# Patient Record
Sex: Male | Born: 1995 | Race: Black or African American | Hispanic: No | Marital: Single | Smoking: Current every day smoker
Health system: Southern US, Community
[De-identification: ages and names within clinical notes are randomized; demographics above are authoritative.]

## PROBLEM LIST (undated history)

## (undated) ENCOUNTER — Emergency Department (HOSPITAL_COMMUNITY): Admission: EM | Payer: Self-pay | Source: Home / Self Care

## (undated) HISTORY — PX: HERNIA REPAIR: SHX51

---

## 2013-04-06 ENCOUNTER — Emergency Department: Payer: Self-pay | Admitting: Emergency Medicine

## 2013-11-23 ENCOUNTER — Emergency Department: Payer: Self-pay | Admitting: Emergency Medicine

## 2014-06-08 ENCOUNTER — Emergency Department: Payer: Self-pay | Admitting: Student

## 2014-06-08 LAB — CBC
HCT: 46.2 % (ref 40.0–52.0)
HGB: 15.1 g/dL (ref 13.0–18.0)
MCH: 30.2 pg (ref 26.0–34.0)
MCHC: 32.7 g/dL (ref 32.0–36.0)
MCV: 92 fL (ref 80–100)
Platelet: 318 10*3/uL (ref 150–440)
RBC: 5 10*6/uL (ref 4.40–5.90)
RDW: 13.4 % (ref 11.5–14.5)
WBC: 6.4 10*3/uL (ref 3.8–10.6)

## 2014-06-08 LAB — BASIC METABOLIC PANEL
Anion Gap: 12 (ref 7–16)
BUN: 8 mg/dL (ref 7–18)
CALCIUM: 9.8 mg/dL (ref 8.5–10.1)
Chloride: 105 mmol/L (ref 98–107)
Co2: 21 mmol/L (ref 21–32)
Creatinine: 1.4 mg/dL — ABNORMAL HIGH (ref 0.60–1.30)
EGFR (African American): >60
Glucose: 168 mg/dL — ABNORMAL HIGH (ref 65–99)
Osmolality: 278 (ref 275–301)
Potassium: 4.1 mmol/L (ref 3.5–5.1)
Sodium: 138 mmol/L (ref 136–145)

## 2014-06-08 LAB — PROTIME-INR
INR: 1
Prothrombin Time: 12.9 secs (ref 11.5–14.7)

## 2014-06-08 LAB — APTT: Activated PTT: 24.8 secs (ref 23.6–35.9)
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
## 2015-01-26 ENCOUNTER — Emergency Department
Admit: 2015-01-26 | Disposition: A | Discharge status: Home / Self Care | Payer: Self-pay | Admitting: Emergency Medicine

## 2015-09-24 ENCOUNTER — Emergency Department
Admission: EM | Admit: 2015-09-24 | Discharge: 2015-09-24 | Disposition: A | Discharge status: Home / Self Care | Payer: Self-pay | Attending: Student | Admitting: Student

## 2015-09-24 ENCOUNTER — Encounter: Payer: Self-pay | Admitting: Emergency Medicine

## 2015-09-24 DIAGNOSIS — R112 Nausea with vomiting, unspecified: Secondary | ICD-10-CM | POA: Insufficient documentation

## 2015-09-24 DIAGNOSIS — F17210 Nicotine dependence, cigarettes, uncomplicated: Secondary | ICD-10-CM | POA: Insufficient documentation

## 2015-09-24 MED ORDER — ONDANSETRON 4 MG PO TBDP
4.0000 mg | ORAL_TABLET | Freq: Once | ORAL | Status: DC
  Filled 2015-09-24: qty 1

## 2015-09-24 NOTE — ED Provider Notes (Signed)
[facility] Emergency Department Provider Note  ____________________________________________  Time seen: Approximately 4:47 AM  I have reviewed the triage vital signs and the nursing notes.   HISTORY  Chief Complaint Emesis    HPI Marvin Myers is a 19 y.o. male with no chronic medical problems who presents for evaluation of and they have resolved nonbloody nonbilious emesis, gradual onset, currently resolved, no modifying factors. Patient reports that on Thanksgiving he vomited multiple times. He also vomited once yesterday morning but has had no recurrent vomiting, no diarrhea, no fevers or chills and has been tolerating by mouth intake very well. He reports he was seen in the ER yesterday and is requesting a work note. No chest pain and difficulty breathing.   History reviewed. No pertinent past medical history.  There are no active problems to display for this patient.   History reviewed. No pertinent past surgical history.  No current outpatient prescriptions on file.  Allergies Review of patient's allergies indicates no known allergies.  History reviewed. No pertinent family history.  Social History Social History  Substance Use Topics  . Smoking status: Current Every Day Smoker -- 2.00 packs/day    Types: Cigars  . Smokeless tobacco: None  . Alcohol Use: Yes    Review of Systems Constitutional: No fever/chills Eyes: No visual changes. ENT: No sore throat. Cardiovascular: Denies chest pain. Respiratory: Denies shortness of breath. Gastrointestinal: No abdominal pain.  + nausea, + vomiting.  No diarrhea.  No constipation. Genitourinary: Negative for dysuria. Musculoskeletal: Negative for back pain. Skin: Negative for rash. Neurological: Negative for headaches, focal weakness or numbness.  10-point ROS otherwise negative.  ____________________________________________   PHYSICAL EXAM:  VITAL SIGNS: ED Triage Vitals  Enc  Vitals Group     BP 09/24/15 0142 105/40 mmHg     Pulse Rate 09/24/15 0142 72     Resp 09/24/15 0142 16     Temp 09/24/15 0142 97.8 F (36.6 C)     Temp Source 09/24/15 0142 Oral     SpO2 09/24/15 0142 97 %     Weight 09/24/15 0142 145 lb (65.772 kg)     Height 09/24/15 0142  (1.702 m)     Head Cir --      Peak Flow --      Pain Score 09/24/15 0143 2     Pain Loc --      Pain Edu? --      Excl. in GC? --     Constitutional: Alert and oriented. Well appearing and in no acute distress. Eyes: Conjunctivae are normal. PERRL. EOMI. Head: Atraumatic. Nose: No congestion/rhinnorhea. Mouth/Throat: Mucous membranes are moist.  Oropharynx non-erythematous. Neck: No stridor.   Cardiovascular: Normal rate, regular rhythm. Grossly normal heart sounds.  Good peripheral circulation. Respiratory: Normal respiratory effort.  No retractions. Lungs CTAB. Gastrointestinal: Soft and nontender. No distention.  No CVA tenderness. Genitourinary: deferred Musculoskeletal: No lower extremity tenderness nor edema.  No joint effusions. Neurologic:  Normal speech and language. No gross focal neurologic deficits are appreciated. No gait instability. Skin:  Skin is warm, dry and intact. No rash noted. Psychiatric: Mood and affect are normal. Speech and behavior are normal.  ____________________________________________   LABS (all labs ordered are listed, but only abnormal results are displayed)  Labs Reviewed - No data to display ____________________________________________  EKG  none ____________________________________________  RADIOLOGY  none ____________________________________________   PROCEDURES  Procedure(s) performeInstitute For Orthopedic Surgerymed: No  ____________________________________________   INITIAL  IMPRESSION / ASSESSMENT AND PLAN / ED COURSE  Pertinent labs & imaging results that were available during my care of the patient were reviewed by me and considered in  my medical decision making (see chart for details).  Marvin Myers is a 19 y.o. male with no chronic medical problems who presents for evaluation of and they have resolved nonbloody nonbilious emesis, gradual onset, currently resolved, no modifying factors. On exam, he is very well-appearing and in no acute distress. Vital signs stable and he is afebrile. Has a benign physical exam and reports that he feels well. Suspect resolved viral or foodborne GI illness.  No indication for labs or imaging at this time. He is essentially here for a work note. There is no record of him being seen in this emergency department yesterday. Discussed return precautions and need for PCP follow-up and he is comfortable with the discharge plan.  ____________________________________________   FINAL CLINICAL IMPRESSION(S) / ED DIAGNOSES  Final diagnoses:  Non-intractable vomiting with nausea, vomiting of unspecified type      Gayla DossEryka A Dimitri Shakespeare, MD 09/24/15 [phone]

## 2015-09-24 NOTE — ED Notes (Signed)
Patient with no complaints at this time. Respirations even and unlabored. Skin warm/dry. Discharge instructions reviewed with patient at this time. Patient given opportunity to voice concerns/ask questions. Patient discharged at this time and left Emergency Department with steady gait.   

## 2015-09-24 NOTE — ED Notes (Signed)
Patient states he was sent to the ER for a work note. No further needs at this time.

## 2015-09-24 NOTE — ED Notes (Signed)
Pt. States he vomited multiple times Thursday night and was sent home from work.  Pt. States he vomited one time Friday morning and has not vomited after.  Pt. States he has been able to hold down food and drink for the rest of day with no problems.

## 2015-11-16 ENCOUNTER — Encounter: Payer: Self-pay | Admitting: Emergency Medicine

## 2015-11-16 ENCOUNTER — Emergency Department
Admission: EM | Admit: 2015-11-16 | Discharge: 2015-11-16 | Disposition: A | Discharge status: Home / Self Care | Payer: Self-pay | Attending: Emergency Medicine | Admitting: Emergency Medicine

## 2015-11-16 DIAGNOSIS — R630 Anorexia: Secondary | ICD-10-CM | POA: Insufficient documentation

## 2015-11-16 DIAGNOSIS — R112 Nausea with vomiting, unspecified: Secondary | ICD-10-CM | POA: Insufficient documentation

## 2015-11-16 DIAGNOSIS — F17210 Nicotine dependence, cigarettes, uncomplicated: Secondary | ICD-10-CM | POA: Insufficient documentation

## 2015-11-16 DIAGNOSIS — R197 Diarrhea, unspecified: Secondary | ICD-10-CM | POA: Insufficient documentation

## 2015-11-16 DIAGNOSIS — R1084 Generalized abdominal pain: Secondary | ICD-10-CM | POA: Insufficient documentation

## 2015-11-16 NOTE — ED Notes (Signed)
Pt started having nausea and vomiting with diarrhea and fever last night around 7pm.  Also with stomach cramping.

## 2015-11-16 NOTE — Discharge Instructions (Signed)
Abdominal Pain, Adult Many things can cause abdominal pain. Usually, abdominal pain is not caused by a disease and will improve without treatment. It can often be observed and treated at home. Your health care provider will do a physical exam and possibly order blood tests and X-rays to help determine the seriousness of your pain. However, in many cases, more time must pass before a clear cause of the pain can be found. Before that point, your health care provider may not know if you need more testing or further treatment. HOME CARE INSTRUCTIONS Monitor your abdominal pain for any changes. The following actions may help to alleviate any discomfort you are experiencing:  Only take over-the-counter or prescription medicines as directed by your health care provider.  Do not take laxatives unless directed to do so by your health care provider.  Try a clear liquid diet (broth, tea, or water) as directed by your health care provider. Slowly move to a bland diet as tolerated. SEEK MEDICAL CARE IF:  You have unexplained abdominal pain.  You have abdominal pain associated with nausea or diarrhea.  You have pain when you urinate or have a bowel movement.  You experience abdominal pain that wakes you in the night.  You have abdominal pain that is worsened or improved by eating food.  You have abdominal pain that is worsened with eating fatty foods.  You have a fever. SEEK IMMEDIATE MEDICAL CARE IF:  Your pain does not go away within 2 hours.  You keep throwing up (vomiting).  Your pain is felt only in portions of the abdomen, such as the right side or the left lower portion of the abdomen.  You pass bloody or black tarry stools. MAKE SURE YOU:  Understand these instructions.  Will watch your condition.  Will get help right away if you are not doing well or get worse.   This information is not intended to replace advice given to you by your health care provider. Make sure you discuss  any questions you have with your health care provider.   Document Released: 07/25/2005 Document Revised: 07/06/2015 Document Reviewed: 06/24/2013 Elsevier Interactive Patient Education 2016 [employer].  Food Choices to Help Relieve Diarrhea, Adult When you have diarrhea, the foods you eat and your eating habits are very important. Choosing the right foods and drinks can help relieve diarrhea. Also, because diarrhea can last up to 7 days, you need to replace lost fluids and electrolytes (such as sodium, potassium, and chloride) in order to help prevent dehydration.  WHAT GENERAL GUIDELINES DO I NEED TO FOLLOW?  Slowly drink 1 cup (8 oz) of fluid for each episode of diarrhea. If you are getting enough fluid, your urine will be clear or pale yellow.  Eat starchy foods. Some good choices include white rice, white toast, pasta, low-fiber cereal, baked potatoes (without the skin), saltine crackers, and bagels.  Avoid large servings of any cooked vegetables.  Limit fruit to two servings per day. A serving is  cup or 1 small piece.  Choose foods with less than 2 g of fiber per serving.  Limit fats to less than 8 tsp (38 g) per day.  Avoid fried foods.  Eat foods that have probiotics in them. Probiotics can be found in certain dairy products.  Avoid foods and beverages that may increase the speed at which food moves through the stomach and intestines (gastrointestinal tract). Things to avoid include:  High-fiber foods, such as dried fruit, raw fruits and vegetables, nuts,  seeds, and whole grain foods.  Spicy foods and high-fat foods.  Foods and beverages sweetened with high-fructose corn syrup, honey, or sugar alcohols such as xylitol, sorbitol, and mannitol. WHAT FOODS ARE RECOMMENDED? Grains White rice. White, [place], or pita breads (fresh or toasted), including plain rolls, buns, or bagels. White pasta. Saltine, soda, or graham crackers. Pretzels. Low-fiber cereal. Cooked cereals made  with water (such as cornmeal, farina, or cream cereals). Plain muffins. Matzo. Melba toast. Zwieback.  Vegetables Potatoes (without the skin). Strained tomato and vegetable juices. Most well-cooked and canned vegetables without seeds. Tender lettuce. Fruits Cooked or canned applesauce, apricots, cherries, fruit cocktail, grapefruit, peaches, pears, or plums. Fresh bananas, apples without skin, cherries, grapes, cantaloupe, grapefruit, peaches, oranges, or plums.  Meat and Other Protein Products Baked or boiled chicken. Eggs. Tofu. Fish. Seafood. Smooth peanut butter. Ground or well-cooked tender beef, ham, veal, lamb, pork, or poultry.  Dairy Plain yogurt, kefir, and unsweetened liquid yogurt. Lactose-free milk, buttermilk, or soy milk. Plain hard cheese. Beverages Sport drinks. Clear broths. Diluted fruit juices (except prune). Regular, caffeine-free sodas such as ginger ale. Water. Decaffeinated teas. Oral rehydration solutions. Sugar-free beverages not sweetened with sugar alcohols. Other Bouillon, broth, or soups made from recommended foods.  The items listed above may not be a complete list of recommended foods or beverages. Contact your dietitian for more options. WHAT FOODS ARE NOT RECOMMENDED? Grains Whole grain, whole wheat, bran, or rye breads, rolls, pastas, crackers, and cereals. Wild or brown rice. Cereals that contain more than 2 g of fiber per serving. Corn tortillas or taco shells. Cooked or dry oatmeal. Granola. Popcorn. Vegetables Raw vegetables. Cabbage, broccoli, Brussels sprouts, artichokes, baked beans, beet greens, corn, kale, legumes, peas, sweet potatoes, and yams. Potato skins. Cooked spinach and cabbage. Fruits Dried fruit, including raisins and dates. Raw fruits. Stewed or dried prunes. Fresh apples with skin, apricots, mangoes, pears, raspberries, and strawberries.  Meat and Other Protein Products Chunky peanut butter. Nuts and seeds. Beans and lentils. Tomasa Blase.    Dairy High-fat cheeses. Milk, chocolate milk, and beverages made with milk, such as milk shakes. Cream. Ice cream. Sweets and Desserts Sweet rolls, doughnuts, and sweet breads. Pancakes and waffles. Fats and Oils Butter. Cream sauces. Margarine. Salad oils. Plain salad dressings. Olives. Avocados.  Beverages Caffeinated beverages (such as coffee, tea, soda, or energy drinks). Alcoholic beverages. Fruit juices with pulp. Prune juice. Soft drinks sweetened with high-fructose corn syrup or sugar alcohols. Other Coconut. Hot sauce. Chili powder. Mayonnaise. Gravy. Cream-based or milk-based soups.  The items listed above may not be a complete list of foods and beverages to avoid. Contact your dietitian for more information. WHAT SHOULD I DO IF I BECOME DEHYDRATED? Diarrhea can sometimes lead to dehydration. Signs of dehydration include dark urine and dry mouth and skin. If you think you are dehydrated, you should rehydrate with an oral rehydration solution. These solutions can be purchased at pharmacies, retail stores, or online.  Drink -1 cup (120-240 mL) of oral rehydration solution each time you have an episode of diarrhea. If drinking this amount makes your diarrhea worse, try drinking smaller amounts more often. For example, drink 1-3 tsp (5-15 mL) every 5-10 minutes.  A general rule for staying hydrated is to drink 1-2 L of fluid per day. Talk to your health care provider about the specific amount you should be drinking each day. Drink enough fluids to keep your urine clear or pale yellow.   This information is not intended to replace  advice given to you by your health care provider. Make sure you discuss any questions you have with your health care provider.   Document Released: 01/05/2004 Document Revised: 11/05/2014 Document Reviewed: 09/07/2013 Elsevier Interactive Patient Education 2016 Elsevier Inc.  Nausea and Vomiting Nausea is a sick feeling that often comes before throwing up  (vomiting). Vomiting is a reflex where stomach contents come out of your mouth. Vomiting can cause severe loss of body fluids (dehydration). Children and elderly adults can become dehydrated quickly, especially if they also have diarrhea. Nausea and vomiting are symptoms of a condition or disease. It is important to find the cause of your symptoms. CAUSES   Direct irritation of the stomach lining. This irritation can result from increased acid production (gastroesophageal reflux disease), infection, food poisoning, taking certain medicines (such as nonsteroidal anti-inflammatory drugs), alcohol use, or tobacco use.  Signals from the brain.These signals could be caused by a headache, heat exposure, an inner ear disturbance, increased pressure in the brain from injury, infection, a tumor, or a concussion, pain, emotional stimulus, or metabolic problems.  An obstruction in the gastrointestinal tract (bowel obstruction).  Illnesses such as diabetes, hepatitis, gallbladder problems, appendicitis, kidney problems, cancer, sepsis, atypical symptoms of a heart attack, or eating disorders.  Medical treatments such as chemotherapy and radiation.  Receiving medicine that makes you sleep (general anesthetic) during surgery. DIAGNOSIS Your caregiver may ask for tests to be done if the problems do not improve after a few days. Tests may also be done if symptoms are severe or if the reason for the nausea and vomiting is not clear. Tests may include:  Urine tests.  Blood tests.  Stool tests.  Cultures (to look for evidence of infection).  X-rays or other imaging studies. Test results can help your caregiver make decisions about treatment or the need for additional tests. TREATMENT You need to stay well hydrated. Drink frequently but in small amounts.You may wish to drink water, sports drinks, clear broth, or eat frozen ice pops or gelatin dessert to help stay hydrated.When you eat, eating slowly may  help prevent nausea.There are also some antinausea medicines that may help prevent nausea. HOME CARE INSTRUCTIONS   Take all medicine as directed by your caregiver.  If you do not have an appetite, do not force yourself to eat. However, you must continue to drink fluids.  If you have an appetite, eat a normal diet unless your caregiver tells you differently.  Eat a variety of complex carbohydrates (rice, wheat, potatoes, bread), lean meats, yogurt, fruits, and vegetables.  Avoid high-fat foods because they are more difficult to digest.  Drink enough water and fluids to keep your urine clear or pale yellow.  If you are dehydrated, ask your caregiver for specific rehydration instructions. Signs of dehydration may include:  Severe thirst.  Dry lips and mouth.  Dizziness.  Dark urine.  Decreasing urine frequency and amount.  Confusion.  Rapid breathing or pulse. SEEK IMMEDIATE MEDICAL CARE IF:   You have blood or brown flecks (like coffee grounds) in your vomit.  You have black or bloody stools.  You have a severe headache or stiff neck.  You are confused.  You have severe abdominal pain.  You have chest pain or trouble breathing.  You do not urinate at least once every 8 hours.  You develop cold or clammy skin.  You continue to vomit for longer than 24 to 48 hours.  You have a fever. MAKE SURE YOU:   Understand  these instructions.  Will watch your condition.  Will get help right away if you are not doing well or get worse.   This information is not intended to replace advice given to you by your health care provider. Make sure you discuss any questions you have with your health care provider.   Document Released: 10/15/2005 Document Revised: 01/07/2012 Document Reviewed: 03/14/2011 Elsevier Interactive Patient Education [employer]

## 2015-11-16 NOTE — ED Provider Notes (Signed)
CSN: [id number]     Arrival date & time 11/16/15  1633 History   First MD Initiated Contact with Patient 11/16/15 1736     Chief Complaint  Patient presents with  . Nausea  . Emesis  . Diarrhea     (Consider location/radiation/quality/duration/timing/severity/associated sxs/prior Treatment) HPI Comments: This is a WDWN 20yo male, NAD. He is accompanied by a male friend. States he had some general abdominal discomfort yesterday. Noted nausea and vomiting after eating [employer] today. Notes general abdominal cramping that is alleviated with slouching and/or bowel movement. Has been drinking orange juice and keeping down. Denies fevers, chills, back pain, changes in urination, dysuria, hematuria. No blood in the emesis or bowels. No known sick contacts. 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   Patient is a 20 y.o. male presenting with vomiting and diarrhea. The history is provided by the patient.  Emesis Severity:  Mild Duration:  1 day Timing:  Intermittent Quality:  Stomach contents Able to tolerate:  Liquids Progression:  Improving Chronicity:  New Recent urination:  Normal Relieved by:  None tried Associated symptoms: abdominal pain and diarrhea   Associated symptoms: no arthralgias, no chills, no fever, no headaches, no myalgias and no sore throat   Abdominal pain:    Location:  Generalized   Quality:  Cramping   Severity:  Moderate   Onset quality:  Sudden   Duration:  1 day   Timing:  Rare   Progression:  Improving   Chronicity:  New Diarrhea:    Quality:  Semi-solid   Severity:  Mild   Timing:  Intermittent   Progression:  Improving Risk factors: no sick contacts and no suspect food intake   Diarrhea Associated symptoms: abdominal pain and vomiting   Associated symptoms: no arthralgias, no chills, no fever, no headaches and no myalgias     History reviewed. No pertinent past medical history. Past Surgical History  Procedure Laterality Date  . Hernia repair      No family history on file. Social History  Substance Use Topics  . Smoking status: Current Every Day Smoker -- 0.50 packs/day    Types: Cigars  . Smokeless tobacco: None  . Alcohol Use: Yes     Comment: approximately 5 days out of the month    Review of Systems  Constitutional: Positive for appetite change (decreased). Negative for fever, chills, activity change and fatigue.  HENT: Negative for sore throat.   Respiratory: Negative for shortness of breath.   Cardiovascular: Negative for chest pain.  Gastrointestinal: Positive for nausea, vomiting, abdominal pain and diarrhea. Negative for constipation, blood in stool and abdominal distention.  Endocrine: Negative for polyuria.  Genitourinary: Negative for dysuria, frequency, hematuria, flank pain and difficulty urinating.  Musculoskeletal: Negative for myalgias, back pain and arthralgias.  Skin: Negative for rash.  Neurological: Negative for dizziness and headaches.      Allergies  Review of patient's allergies indicates no known allergies.  Home Medications   Prior to Admission medications   Not on File   BP 107/58 mmHg  Pulse 80  Temp(Src) 98.2 F (36.8 C) (Oral)  Resp 18  Ht  (1.676 m)  Wt 68.04 kg  BMI 24.22 kg/m2  SpO2 98% Physical Exam  Constitutional: He appears well-developed and well-nourished. No distress.  HENT:  Head: Normocephalic.  Eyes: Conjunctivae are normal.  Neck: Neck supple.  Cardiovascular: Normal rate, regular rhythm and normal heart sounds.  Exam reveals no gallop and no friction rub.  No murmur heard. Pulmonary/Chest: Effort normal and breath sounds normal. No respiratory distress. He has no wheezes. He has no rales. He exhibits no tenderness.  Abdominal: Soft. Normal appearance and bowel sounds are normal. He exhibits no distension, no abdominal bruit, no pulsatile midline mass and no mass. There is no hepatosplenomegaly. There is no tenderness. There is no rigidity, no rebound, no  guarding, no CVA tenderness, no tenderness at McBurney's point and negative Murphy's sign.  Neurological: He is alert.  Skin: No rash noted. No erythema.  Psychiatric: He has a normal mood and affect.    ED Course  Procedures (no procedures)  MDM Number of Diagnoses or Management Options Diarrhea, unspecified type:  Generalized abdominal pain:  Non-intractable vomiting with nausea, vomiting of unspecified type:        Hope Pigeon, PA-C 11/16/15 1810  Governor Rooks, MD 11/16/15 2219

## 2015-11-16 NOTE — ED Notes (Signed)
Pt has n/v/d since last night.  Reports abd cramping.  No back pain. No fever.  Pt alert.  No acute distress, talking on cell phone.

## 2016-01-21 ENCOUNTER — Encounter: Payer: Self-pay | Admitting: Emergency Medicine

## 2016-01-21 ENCOUNTER — Emergency Department: Payer: Self-pay

## 2016-01-21 ENCOUNTER — Emergency Department
Admission: EM | Admit: 2016-01-21 | Discharge: 2016-01-21 | Disposition: A | Discharge status: Home / Self Care | Payer: Self-pay | Attending: Emergency Medicine | Admitting: Emergency Medicine

## 2016-01-21 DIAGNOSIS — Y929 Unspecified place or not applicable: Secondary | ICD-10-CM | POA: Insufficient documentation

## 2016-01-21 DIAGNOSIS — Y9367 Activity, basketball: Secondary | ICD-10-CM | POA: Insufficient documentation

## 2016-01-21 DIAGNOSIS — F17210 Nicotine dependence, cigarettes, uncomplicated: Secondary | ICD-10-CM | POA: Insufficient documentation

## 2016-01-21 DIAGNOSIS — W500XXA Accidental hit or strike by another person, initial encounter: Secondary | ICD-10-CM | POA: Insufficient documentation

## 2016-01-21 DIAGNOSIS — S8001XA Contusion of right knee, initial encounter: Secondary | ICD-10-CM | POA: Insufficient documentation

## 2016-01-21 DIAGNOSIS — Y999 Unspecified external cause status: Secondary | ICD-10-CM | POA: Insufficient documentation

## 2016-01-21 MED ORDER — NAPROXEN 500 MG PO TABS: 500.0000 mg | ORAL_TABLET | Freq: Two times a day (BID) | ORAL | Status: DC

## 2016-01-21 NOTE — ED Notes (Signed)
AAOx3.  Skin warm and dry.  Ambulates with easy and steady gait. NAD 

## 2016-01-21 NOTE — Discharge Instructions (Signed)
Contusion °A contusion is a deep bruise. Contusions are the result of a blunt injury to tissues and muscle fibers under the skin. The injury causes bleeding under the skin. The skin overlying the contusion may turn blue, purple, or yellow. Minor injuries will give you a painless contusion, but more severe contusions may stay painful and swollen for a few weeks.  °CAUSES  °This condition is usually caused by a blow, trauma, or direct force to an area of the body. °SYMPTOMS  °Symptoms of this condition include: °· Swelling of the injured area. °· Pain and tenderness in the injured area. °· Discoloration. The area may have redness and then turn blue, purple, or yellow. °DIAGNOSIS  °This condition is diagnosed based on a physical exam and medical history. An X-ray, CT scan, or MRI may be needed to determine if there are any associated injuries, such as broken bones (fractures). °TREATMENT  °Specific treatment for this condition depends on what area of the body was injured. In general, the best treatment for a contusion is resting, icing, applying pressure to (compression), and elevating the injured area. This is often called the RICE strategy. Over-the-counter anti-inflammatory medicines may also be recommended for pain control.  °HOME CARE INSTRUCTIONS  °· Rest the injured area. °· If directed, apply ice to the injured area: °· Put ice in a plastic bag. °· Place a towel between your skin and the bag. °· Leave the ice on for 20 minutes, 2-3 times per day. °· If directed, apply light compression to the injured area using an elastic bandage. Make sure the bandage is not wrapped too tightly. Remove and reapply the bandage as directed by your health care provider. °· If possible, raise (elevate) the injured area above the level of your heart while you are sitting or lying down. °· Take over-the-counter and prescription medicines only as told by your health care provider. °SEEK MEDICAL CARE IF: °· Your symptoms do not  improve after several days of treatment. °· Your symptoms get worse. °· You have difficulty moving the injured area. °SEEK IMMEDIATE MEDICAL CARE IF:  °· You have severe pain. °· You have numbness in a hand or foot. °· Your hand or foot turns pale or cold. °  °This information is not intended to replace advice given to you by your health care provider. Make sure you discuss any questions you have with your health care provider. °  °Document Released: 07/25/2005 Document Revised: 07/06/2015 Document Reviewed: 03/02/2015 °Elsevier Interactive Patient Education ©2016 [employer] ° °Cryotherapy °Cryotherapy means treatment with cold. Ice or gel packs can be used to reduce both pain and swelling. Ice is the most helpful within the first 24 to 48 hours after an injury or flare-up from overusing a muscle or joint. Sprains, strains, spasms, burning pain, shooting pain, and aches can all be eased with ice. Ice can also be used when recovering from surgery. Ice is effective, has very few side effects, and is safe for most people to use. °PRECAUTIONS  °Ice is not a safe treatment option for people with: °· Raynaud phenomenon. This is a condition affecting small blood vessels in the extremities. Exposure to cold may cause your problems to return. °· Cold hypersensitivity. There are many forms of cold hypersensitivity, including: °¨ Cold urticaria. Red, itchy hives appear on the skin when the tissues begin to warm after being iced. °¨ Cold erythema. This is a red, itchy rash caused by exposure to cold. °¨ Cold hemoglobinuria. Red blood cells   break down when the tissues begin to warm after being iced. The hemoglobin that carry oxygen are passed into the urine because they cannot combine with blood proteins fast enough. °· Numbness or altered sensitivity in the area being iced. °If you have any of the following conditions, do not use ice until you have discussed cryotherapy with your caregiver: °· Heart conditions, such as  arrhythmia, angina, or chronic heart disease. °· High blood pressure. °· Healing wounds or open skin in the area being iced. °· Current infections. °· Rheumatoid arthritis. °· Poor circulation. °· Diabetes. °Ice slows the blood flow in the region it is applied. This is beneficial when trying to stop inflamed tissues from spreading irritating chemicals to surrounding tissues. However, if you expose your skin to cold temperatures for too long or without the proper protection, you can damage your skin or nerves. Watch for signs of skin damage due to cold. °HOME CARE INSTRUCTIONS °Follow these tips to use ice and cold packs safely. °· Place a dry or damp towel between the ice and skin. A damp towel will cool the skin more quickly, so you may need to shorten the time that the ice is used. °· For a more rapid response, add gentle compression to the ice. °· Ice for no more than 10 to 20 minutes at a time. The bonier the area you are icing, the less time it will take to get the benefits of ice. °· Check your skin after 5 minutes to make sure there are no signs of a poor response to cold or skin damage. °· Rest 20 minutes or more between uses. °· Once your skin is numb, you can end your treatment. You can test numbness by very lightly touching your skin. The touch should be so light that you do not see the skin dimple from the pressure of your fingertip. When using ice, most people will feel these normal sensations in this order: cold, burning, aching, and numbness. °· Do not use ice on someone who cannot communicate their responses to pain, such as small children or people with dementia. °HOW TO MAKE AN ICE PACK °Ice packs are the most common way to use ice therapy. Other methods include ice massage, ice baths, and cryosprays. Muscle creams that cause a cold, tingly feeling do not offer the same benefits that ice offers and should not be used as a substitute unless recommended by your caregiver. °To make an ice pack, do one  of the following: °· Place crushed ice or a bag of frozen vegetables in a sealable plastic bag. Squeeze out the excess air. Place this bag inside another plastic bag. Slide the bag into a pillowcase or place a damp towel between your skin and the bag. °· Mix 3 parts water with 1 part rubbing alcohol. Freeze the mixture in a sealable plastic bag. When you remove the mixture from the freezer, it will be slushy. Squeeze out the excess air. Place this bag inside another plastic bag. Slide the bag into a pillowcase or place a damp towel between your skin and the bag. °SEEK MEDICAL CARE IF: °· You develop white spots on your skin. This may give the skin a blotchy (mottled) appearance. °· Your skin turns blue or pale. °· Your skin becomes waxy or hard. °· Your swelling gets worse. °MAKE SURE YOU:  °· Understand these instructions. °· Will watch your condition. °· Will get help right away if you are not doing well or get worse. °  °  This information is not intended to replace advice given to you by your health care provider. Make sure you discuss any questions you have with your health care provider. °  °Document Released: 06/11/2011 Document Revised: 11/05/2014 Document Reviewed: 06/11/2011 °Elsevier Interactive Patient Education ©2016 [employer] ° °

## 2016-01-21 NOTE — ED Provider Notes (Signed)
CSN: [id number]     Arrival date & time 01/21/16  1706 History   First MD Initiated Contact with Patient 01/21/16 1717     Chief Complaint  Patient presents with  . Knee Pain     (Consider location/radiation/quality/duration/timing/severity/associated sxs/prior Treatment) HPI  20 year old male presents for evaluation of right knee pain. Patient states he does play basketball today at 1 PM when someone kneed him in the lateral aspect of his right knee. He remained ambulatory, denies any significant swelling but has pain with weightbearing. He has been ambulatory with assistive devices. Pain is moderate. He has not had any medications for pain. Denies any numbness or tingling. No groin hip or ankle pain.  History reviewed. No pertinent past medical history. Past Surgical History  Procedure Laterality Date  . Hernia repair     No family history on file. Social History  Substance Use Topics  . Smoking status: Current Every Day Smoker -- 0.50 packs/day    Types: Cigars  . Smokeless tobacco: None  . Alcohol Use: Yes     Comment: approximately 5 days out of the month    Review of Systems  Constitutional: Negative.  Negative for fever, chills, activity change and appetite change.  HENT: Negative for congestion, ear pain, mouth sores, rhinorrhea, sinus pressure, sore throat and trouble swallowing.   Eyes: Negative for photophobia, pain and discharge.  Respiratory: Negative for cough, chest tightness and shortness of breath.   Cardiovascular: Negative for chest pain and leg swelling.  Gastrointestinal: Negative for nausea, vomiting, abdominal pain, diarrhea and abdominal distention.  Genitourinary: Negative for dysuria and difficulty urinating.  Musculoskeletal: Positive for arthralgias and gait problem. Negative for back pain.  Skin: Negative for color change and rash.  Neurological: Negative for dizziness and headaches.  Hematological: Negative for adenopathy.  Psychiatric/Behavioral:  Negative for behavioral problems and agitation.      Allergies  Review of patient's allergies indicates no known allergies.  Home Medications   Prior to Admission medications   Medication Sig Start Date End Date Taking? Authorizing Provider  naproxen (NAPROSYN) 500 MG tablet Take 1 tablet (500 mg total) by mouth 2 (two) times daily with a meal. 01/21/16   Evon Slack, PA-C   BP 102/44 mmHg  Pulse 74  Temp(Src) 98.4 F (36.9 C)  Resp 18  Ht  (1.676 m)  Wt 68.04 kg  BMI 24.22 kg/m2  SpO2 98% Physical Exam  Constitutional: He is oriented to person, place, and time. He appears well-developed and well-nourished.  HENT:  Head: Normocephalic and atraumatic.  Eyes: Conjunctivae and EOM are normal. Pupils are equal, round, and reactive to light.  Neck: Normal range of motion. Neck supple.  Cardiovascular: Normal rate, regular rhythm, normal heart sounds and intact distal pulses.   Pulmonary/Chest: Effort normal and breath sounds normal. No respiratory distress. He has no wheezes. He has no rales. He exhibits no tenderness.  Abdominal: Soft. Bowel sounds are normal. He exhibits no distension. There is no tenderness.  Musculoskeletal:  Examination of the right lower extremity shows the patient has full range of motion of the hip knee and ankle with no discomfort. There is no warmth erythema swelling or effusion of the right knee. Patient is minimally tender to palpation along the lateral joint line. There is no valgus or varus laxity to the knee. He has a negative anterior posterior drawer test. Negative Murray's test. He is able to straight leg raise. No quads tendon or patellar ligament deficits.  Lymphadenopathy:    He has no cervical adenopathy.  Neurological: He is alert and oriented to person, place, and time.  Skin: Skin is warm and dry.  Psychiatric: He has a normal mood and affect. His behavior is normal. Judgment and thought content normal.    ED Course  Procedures  (including critical care time) Labs Review Labs Reviewed - No data to display  Imaging Review Dg Knee 2 Views Right  01/21/2016  CLINICAL DATA:  Injury playing basketball, lateral pain hit by some body in lateral aspect of the right knee EXAM: RIGHT KNEE - 1-2 VIEW COMPARISON:  None. FINDINGS: Two views of the right knee submitted. No acute fracture or subluxation. Joint space is preserved. No radiopaque foreign body. No joint effusion. IMPRESSION: Negative. Electronically Signed   By: Natasha MeadLiviu  Pop M.D.   On: 01/21/2016 17:52   I have personally reviewed and evaluated these images and lab results as part of my medical decision-making.   EKG Interpretation None      MDM   Final diagnoses:  Knee contusion, right, initial encounter    20 year old male with right knee contusion. Knee is stable to stress testing. No laxity on exam. X-rays negative for any acute fracture. Patient will rest ice and elevate, take naproxen 500 mg twice a day as needed for pain.    Evon Slackhomas C Gaines, PA-C 01/21/16 1815  Arnaldo NatalPaul F Malinda, MD 01/22/16 [phone]

## 2016-01-21 NOTE — ED Notes (Signed)
R knee pain since another player ran into him while he was playing basketball approx 1 pm today.

## 2016-04-30 IMAGING — DX DG KNEE 1-2V*R*
2 series · 2 of 2 positions shown · non-contrast
Comparison: None.

CLINICAL DATA: Injury playing basketball, lateral pain hit by some
body in lateral aspect of the right knee

EXAM:
RIGHT KNEE - 1-2 VIEW

[knee ap]
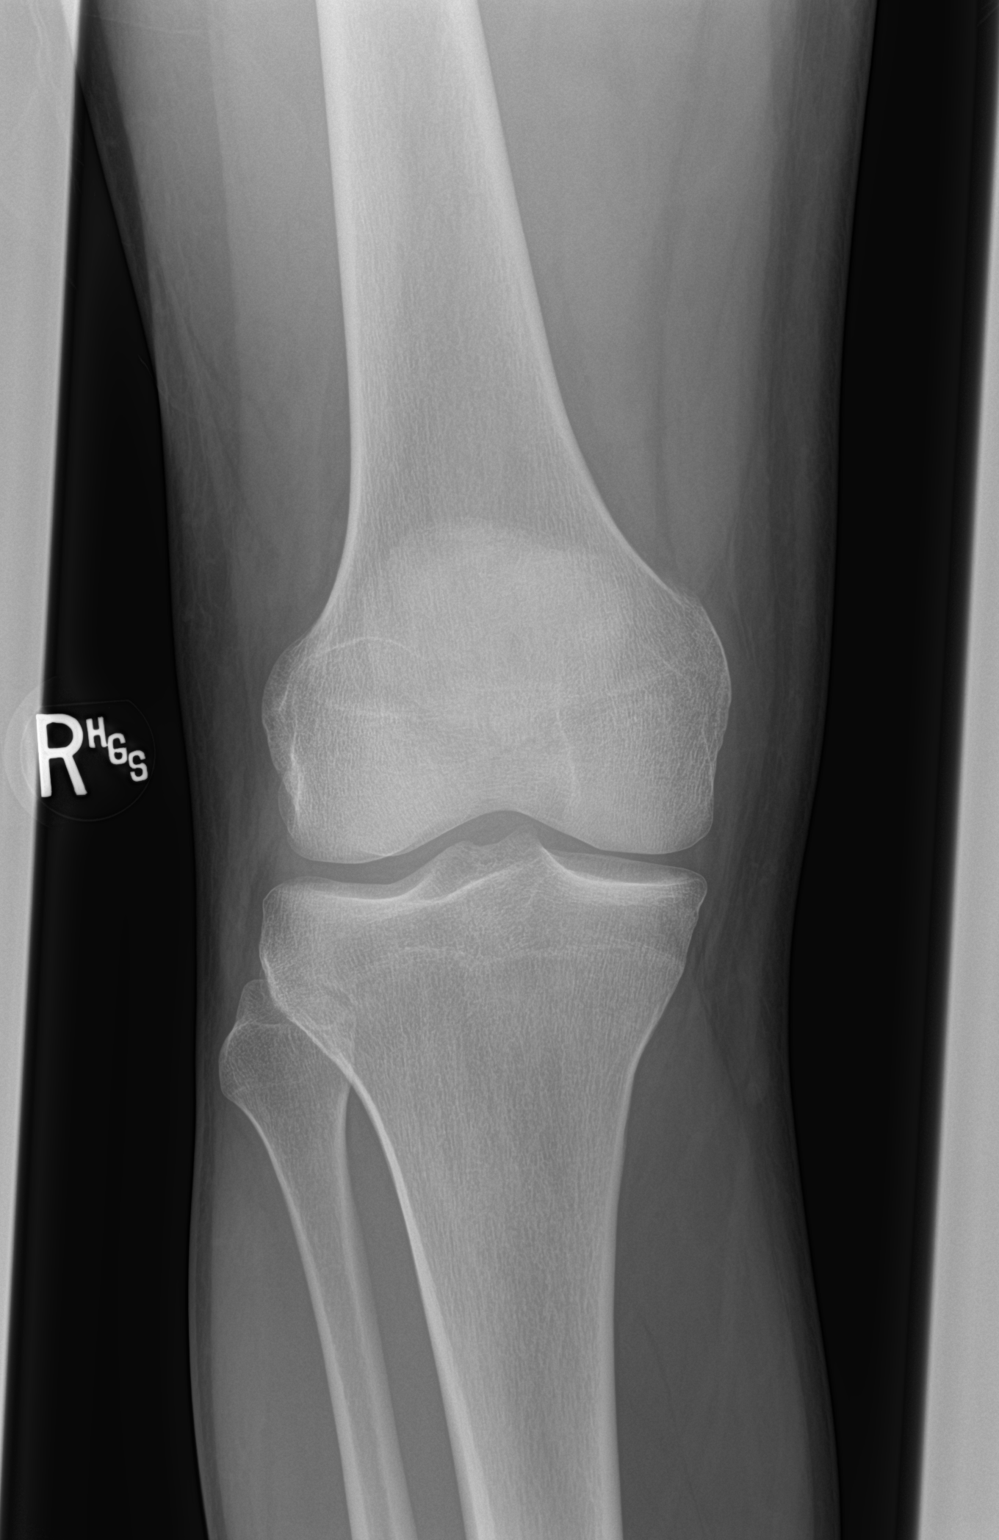

[knee lat]
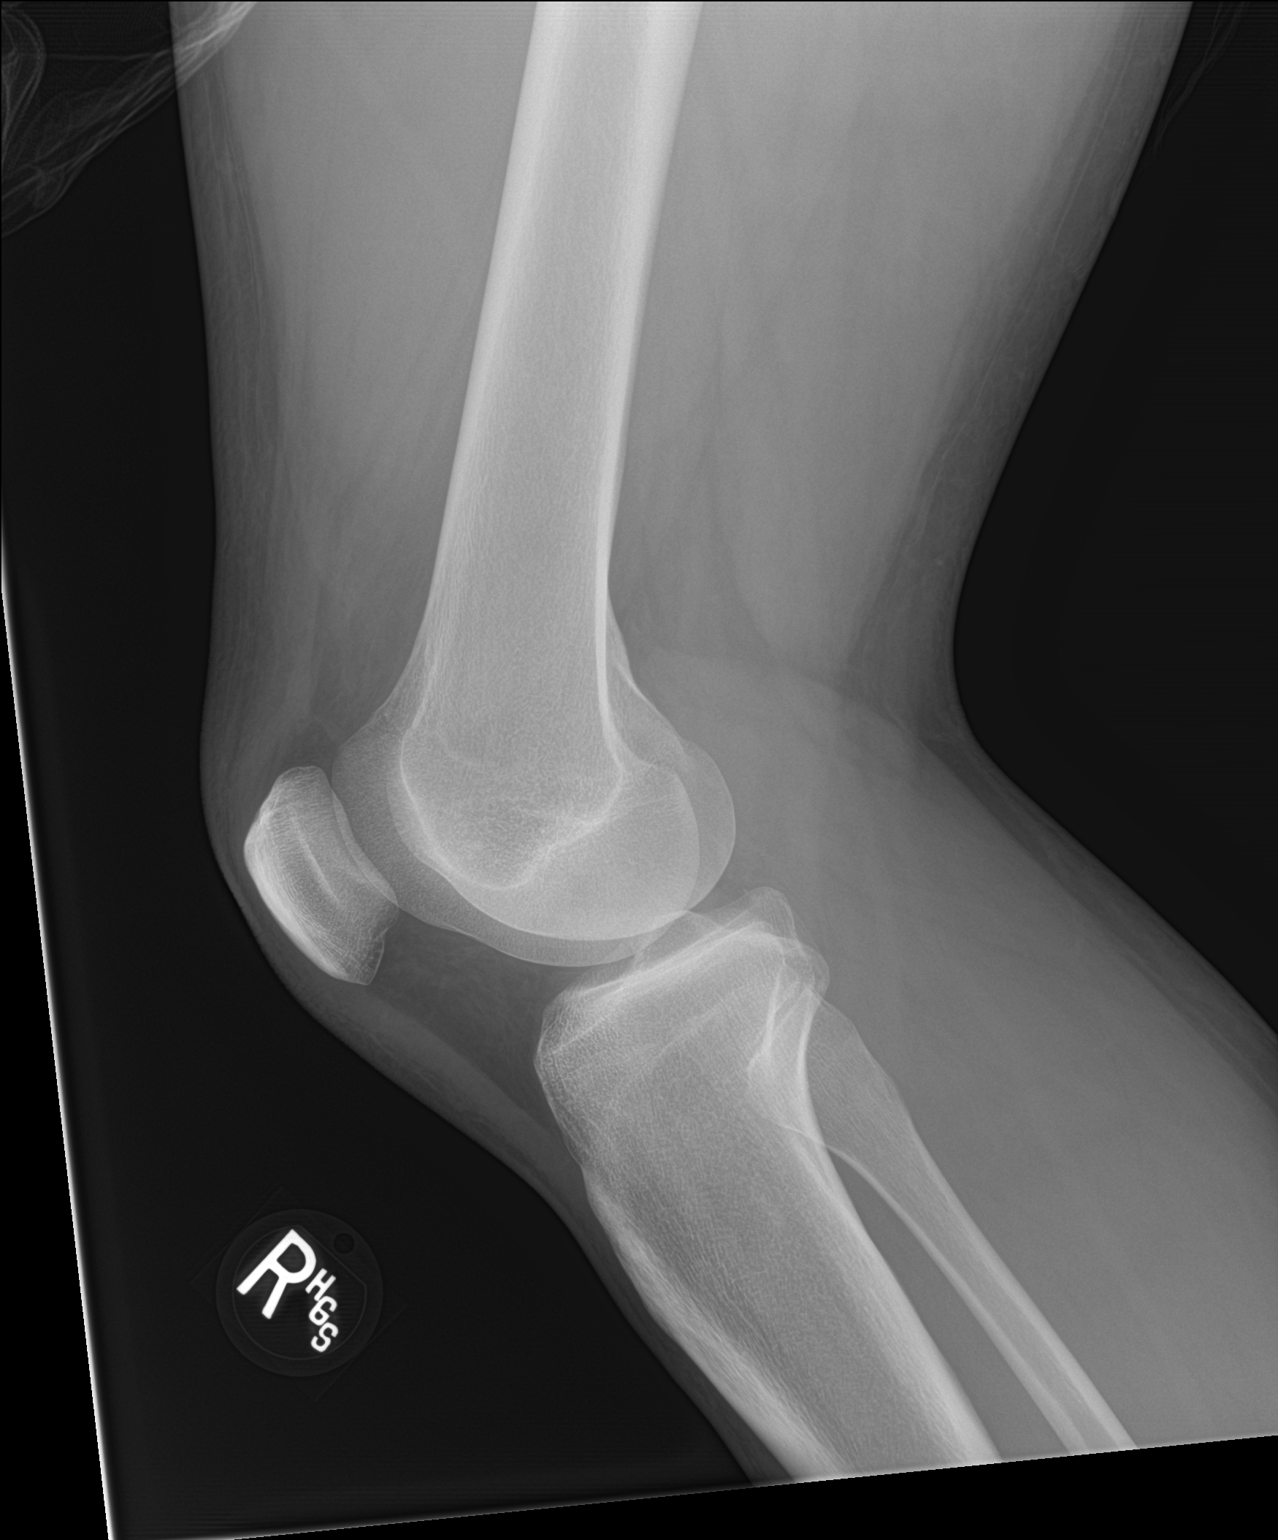

[2 of 2 positions shown; findings below may reference images not displayed]

FINDINGS: Two views of the right knee submitted. No acute fracture or
subluxation. Joint space is preserved. No radiopaque foreign body.
No joint effusion.
IMPRESSION: Negative.

## 2019-06-22 ENCOUNTER — Other Ambulatory Visit: Payer: Self-pay

## 2019-06-22 ENCOUNTER — Encounter: Payer: Self-pay | Admitting: Emergency Medicine

## 2019-06-22 DIAGNOSIS — R05 Cough: Secondary | ICD-10-CM | POA: Insufficient documentation

## 2019-06-22 DIAGNOSIS — Z79899 Other long term (current) drug therapy: Secondary | ICD-10-CM | POA: Insufficient documentation

## 2019-06-22 LAB — CBC
HCT: 44.5 % (ref 39.0–52.0)
Hemoglobin: 14.8 g/dL (ref 13.0–17.0)
MCH: 29.8 pg (ref 26.0–34.0)
MCHC: 33.3 g/dL (ref 30.0–36.0)
MCV: 89.5 fL (ref 80.0–100.0)
Platelets: 271 10*3/uL (ref 150–400)
RBC: 4.97 MIL/uL (ref 4.22–5.81)
RDW: 13 % (ref 11.5–15.5)
WBC: 7.2 10*3/uL (ref 4.0–10.5)
nRBC: 0 % (ref 0.0–0.2)

## 2019-06-22 NOTE — ED Triage Notes (Signed)
Patient to ER for c/o chest pain with cough x2 days. Patient denies any known fevers.

## 2019-06-23 ENCOUNTER — Emergency Department
Admission: EM | Admit: 2019-06-23 | Discharge: 2019-06-23 | Disposition: A | Discharge status: Home / Self Care | Payer: Self-pay | Attending: Emergency Medicine | Admitting: Emergency Medicine

## 2019-06-23 ENCOUNTER — Other Ambulatory Visit: Payer: Self-pay

## 2019-06-23 DIAGNOSIS — R059 Cough, unspecified: Secondary | ICD-10-CM

## 2019-06-23 DIAGNOSIS — R079 Chest pain, unspecified: Secondary | ICD-10-CM

## 2019-06-23 DIAGNOSIS — F17210 Nicotine dependence, cigarettes, uncomplicated: Secondary | ICD-10-CM | POA: Insufficient documentation

## 2019-06-23 DIAGNOSIS — R05 Cough: Secondary | ICD-10-CM

## 2019-06-23 DIAGNOSIS — R1084 Generalized abdominal pain: Secondary | ICD-10-CM | POA: Insufficient documentation

## 2019-06-23 DIAGNOSIS — Z79899 Other long term (current) drug therapy: Secondary | ICD-10-CM | POA: Insufficient documentation

## 2019-06-23 LAB — BASIC METABOLIC PANEL
Anion gap: 8 (ref 5–15)
BUN: 13 mg/dL (ref 6–20)
CO2: 24 mmol/L (ref 22–32)
Calcium: 9.2 mg/dL (ref 8.9–10.3)
Chloride: 105 mmol/L (ref 98–111)
Creatinine, Ser: 0.97 mg/dL (ref 0.61–1.24)
GFR calc Af Amer: >60 mL/min (ref >60)
GFR calc non Af Amer: >60 mL/min (ref >60)
Glucose, Bld: 103 mg/dL — ABNORMAL HIGH (ref 70–99)
Potassium: 3.8 mmol/L (ref 3.5–5.1)
Sodium: 137 mmol/L (ref 135–145)

## 2019-06-23 LAB — TROPONIN I (HIGH SENSITIVITY): Troponin I (High Sensitivity): 4 ng/L (ref <18)

## 2019-06-23 NOTE — ED Notes (Signed)
No answer when called several times from lobby 

## 2019-06-23 NOTE — ED Triage Notes (Addendum)
Pt in with co abd pain with n.v.d for 2 days. States no vomiting or diarrhea since early in the am, just nausea. Denies any dysuria, was here yesterday but left prior to being seen.

## 2019-06-24 ENCOUNTER — Emergency Department
Admission: EM | Admit: 2019-06-24 | Discharge: 2019-06-24 | Disposition: A | Discharge status: Home / Self Care | Payer: Self-pay | Attending: Emergency Medicine | Admitting: Emergency Medicine

## 2019-06-24 DIAGNOSIS — R1084 Generalized abdominal pain: Secondary | ICD-10-CM

## 2019-06-24 MED ORDER — ONDANSETRON 4 MG PO TBDP: 4.0000 mg | ORAL_TABLET | Freq: Three times a day (TID) | ORAL | 0 refills | Status: DC | PRN

## 2019-06-24 MED ORDER — ONDANSETRON 4 MG PO TBDP
8.0000 mg | ORAL_TABLET | Freq: Once | ORAL | Status: AC
  Administered 2019-06-24: 04:00:00 8 mg via ORAL
  Filled 2019-06-24: qty 2

## 2019-06-24 MED ORDER — DICYCLOMINE HCL 20 MG PO TABS: 20.0000 mg | ORAL_TABLET | Freq: Three times a day (TID) | ORAL | 0 refills | Status: DC | PRN

## 2019-06-24 MED ORDER — DICYCLOMINE HCL 10 MG PO CAPS
10.0000 mg | ORAL_CAPSULE | Freq: Once | ORAL | Status: AC
  Administered 2019-06-24: 10 mg via ORAL
  Filled 2019-06-24: qty 1

## 2019-06-24 NOTE — ED Provider Notes (Signed)
[facility] Emergency Department Provider Note  ____________________________________________  Time seen: Approximately 5:47 AM  I have reviewed the triage vital signs and the nursing notes.   HISTORY  Chief Complaint Abdominal Pain    HPI Marvin Myers is a 23 y.o. male with no significant past medical history who complains of generalized abdominal cramping, nonradiating, no aggravating or alleviating factors, waxing and waning for the past 2 days.  Associated nausea and vomiting 2 days ago but now just has nausea.  Was able to eat in the ED waiting room.  Denies dysuria frequency urgency fevers chills or body aches.  No sick contacts.      Past medical history noncontributory   There are no active problems to display for this patient.    Past Surgical History:  Procedure Laterality Date  . HERNIA REPAIR       Prior to Admission medications   Medication Sig Start Date End Date Taking? Authorizing Provider  dicyclomine (BENTYL) 20 MG tablet Take 1 tablet (20 mg total) by mouth 3 (three) times daily as needed for spasms. 06/24/19   Carrie Mew, MD  naproxen (NAPROSYN) 500 MG tablet Take 1 tablet (500 mg total) by mouth 2 (two) times daily with a meal. 01/21/16   Duanne Guess, PA-C  ondansetron (ZOFRAN ODT) 4 MG disintegrating tablet Take 1 tablet (4 mg total) by mouth every 8 (eight) hours as needed for nausea or vomiting. 06/24/19   Carrie Mew, MD     Allergies Patient has no known allergies.   No family history on file.  Social History Social History   Tobacco Use  . Smoking status: Current Every Day Smoker    Packs/day: 0.50    Types: Cigars  . Smokeless tobacco: Never Used  Substance Use Topics  . Alcohol use: Yes    Comment: approximately 5 days out of the month  . Drug use: No    Review of Systems  Constitutional:   No fever or chills.  ENT:   No sore throat. No rhinorrhea. Cardiovascular:   No chest pain  or syncope. Respiratory:   No dyspnea or cough. Gastrointestinal: Positive as above for abdominal pain, vomiting and diarrhea.  Musculoskeletal:   Negative for focal pain or swelling All other systems reviewed and are negative except as documented above in ROS and HPI.  ____________________________________________   PHYSICAL EXAM:  VITAL SIGNS: ED Triage Vitals  Enc Vitals Group     BP 06/23/19 2354 122/79     Pulse Rate 06/23/19 2354 70     Resp 06/23/19 2354 20     Temp 06/23/19 2354 98.6 F (37 C)     Temp Source 06/23/19 2354 Oral     SpO2 06/23/19 2354 97 %     Weight 06/23/19 2355 170 lb (77.1 kg)     Height 06/23/19 2355 5\' 7"  (1.702 m)     Head Circumference --      Peak Flow --      Pain Score 06/23/19 2354 8     Pain Loc --      Pain Edu? --      Excl. in [place]? --     Vital signs reviewed, nursing assessments reviewed.   Constitutional:   Alert and oriented. Non-toxic appearance.  Sleeping, easily arousable Eyes:   Conjunctivae are normal. EOMI. PERRL. ENT      Head:   Normocephalic and atraumatic.      Nose:   No congestion/rhinnorhea.  Mouth/Throat:   MMM, no pharyngeal erythema. No peritonsillar mass.       Neck:   No meningismus. Full ROM. Hematological/Lymphatic/Immunilogical:   No cervical lymphadenopathy. Cardiovascular:   RRR. Symmetric bilateral radial and DP pulses.  No murmurs. Cap refill less than 2 seconds. Respiratory:   Normal respiratory effort without tachypnea/retractions. Breath sounds are clear and equal bilaterally. No wheezes/rales/rhonchi. Gastrointestinal:   Soft and nontender. Non distended. There is no CVA tenderness.  No rebound, rigidity, or guarding.  Musculoskeletal:   Normal range of motion in all extremities. No joint effusions.  No lower extremity tenderness.  No edema. Neurologic:   Normal speech and language.  Motor grossly intact. No acute focal neurologic deficits are appreciated.  Skin:    Skin is warm, dry and  intact. No rash noted.  No petechiae, purpura, or bullae.  ____________________________________________    LABS (pertinent positives/negatives) (all labs ordered are listed, but only abnormal results are displayed) Labs Reviewed - No data to display ____________________________________________   EKG    ____________________________________________    RADIOLOGY  No results found.  ____________________________________________   PROCEDURES Procedures  ____________________________________________    CLINICAL IMPRESSION / ASSESSMENT AND PLAN / ED COURSE  Medications ordered in the ED: Medications  ondansetron (ZOFRAN-ODT) disintegrating tablet 8 mg (8 mg Oral Given 06/24/19 0405)  dicyclomine (BENTYL) capsule 10 mg (10 mg Oral Given 06/24/19 0405)    Pertinent labs & imaging results that were available during my care of the patient were reviewed by me and considered in my medical decision making (see chart for details).  Marvin Myers was evaluated in Emergency Department on 06/24/2019 for the symptoms described in the history of present illness. He was evaluated in the context of the global COVID-19 pandemic, which necessitated consideration that the patient might be at risk for infection with the SARS-CoV-2 virus that causes COVID-19. Institutional protocols and algorithms that pertain to the evaluation of patients at risk for COVID-19 are in a state of rapid change based on information released by regulatory bodies including the CDC and federal and state organizations. These policies and algorithms were followed during the patient's care in the ED.   Patient presents with abdominal pain vomiting diarrhea which seems to be improving on its own.  Vital signs are normal.  Exam is reassuring.Considering the patient's symptoms, medical history, and physical examination today, I have low suspicion for cholecystitis or biliary pathology, pancreatitis, perforation or bowel  obstruction, hernia, intra-abdominal abscess, AAA or dissection, volvulus or intussusception, mesenteric ischemia, or appendicitis.  Feeling better after Zofran and Bentyl.  We will continue these medicines for supportive care, bland diet, follow-up with primary care.  Stable for discharge home.      ____________________________________________   FINAL CLINICAL IMPRESSION(S) / ED DIAGNOSES    Final diagnoses:  Generalized abdominal pain     ED Discharge Orders         Ordered    dicyclomine (BENTYL) 20 MG tablet  3 times daily PRN     06/24/19 0546    ondansetron (ZOFRAN ODT) 4 MG disintegrating tablet  Every 8 hours PRN     06/24/19 0546          Portions of this note were generated with dragon dictation software. Dictation errors may occur despite best attempts at proofreading.   Sharman CheekStafford, Sebron Mcmahill, MD 06/24/19 [phone]

## 2019-06-24 NOTE — ED Notes (Signed)
Patient is resting comfortably. 

## 2019-06-24 NOTE — ED Notes (Signed)
Pt resting quietly.

## 2020-08-05 ENCOUNTER — Other Ambulatory Visit: Payer: Self-pay

## 2020-08-05 ENCOUNTER — Encounter (HOSPITAL_COMMUNITY): Payer: Self-pay

## 2020-08-05 ENCOUNTER — Emergency Department (HOSPITAL_COMMUNITY)
Admission: EM | Admit: 2020-08-05 | Discharge: 2020-08-05 | Disposition: A | Discharge status: Home / Self Care | Payer: Self-pay | Attending: Emergency Medicine | Admitting: Emergency Medicine

## 2020-08-05 DIAGNOSIS — F17290 Nicotine dependence, other tobacco product, uncomplicated: Secondary | ICD-10-CM | POA: Insufficient documentation

## 2020-08-05 DIAGNOSIS — Z202 Contact with and (suspected) exposure to infections with a predominantly sexual mode of transmission: Secondary | ICD-10-CM | POA: Insufficient documentation

## 2020-08-05 DIAGNOSIS — R300 Dysuria: Secondary | ICD-10-CM | POA: Insufficient documentation

## 2020-08-05 LAB — URINALYSIS, ROUTINE W REFLEX MICROSCOPIC
Bilirubin Urine: NEGATIVE
Glucose, UA: NEGATIVE mg/dL
Hgb urine dipstick: NEGATIVE
Ketones, ur: NEGATIVE mg/dL
Nitrite: NEGATIVE
Protein, ur: NEGATIVE mg/dL
Specific Gravity, Urine: 1.016 (ref 1.005–1.030)
WBC, UA: >50 WBC/hpf — ABNORMAL HIGH (ref 0–5)
pH: 5 (ref 5.0–8.0)

## 2020-08-05 MED ORDER — CEFTRIAXONE SODIUM 500 MG IJ SOLR
500.0000 mg | Freq: Once | INTRAMUSCULAR | Status: AC
  Administered 2020-08-05: 500 mg via INTRAMUSCULAR
  Filled 2020-08-05: qty 500

## 2020-08-05 MED ORDER — DOXYCYCLINE HYCLATE 100 MG PO CAPS: 100.0000 mg | ORAL_CAPSULE | Freq: Two times a day (BID) | ORAL | 0 refills | Status: AC

## 2020-08-05 MED ORDER — LIDOCAINE HCL (PF) 1 % IJ SOLN
INTRAMUSCULAR | Status: AC
  Filled 2020-08-05: qty 5

## 2020-08-05 NOTE — ED Provider Notes (Signed)
[facility] EMERGENCY DEPARTMENT Provider Note   CSN: [id number] Arrival date & time: 08/05/20  1052     History Chief Complaint  Patient presents with   Exposure to STD    Marvin Myers is a 24 y.o. male with no pertinent medical history that presents the emergency department today for STD exposure.  Patient states that he was exposed to chlamydia a week ago, having penetrative intercourse.  States that he started having some mild white discharge 2 days ago.  No lesions on his penis, penile pain, scrotal pain, scrotal swelling.  He states that partner tested positive for chlamydia only, no other STD.  Only had intercourse with her currently.  Denies any fevers, chills, nausea, vomiting, headache, sore throat, abdominal pain, suprapubic pain.  To some mild dysuria, no hematuria.  Had STDs in 2018, states that it feels similar.  Does not want to be tested for HIV, syphilis.  No rash.  Denies oral intercourse.  HPI     History reviewed. No pertinent past medical history.  There are no problems to display for this patient.   Past Surgical History:  Procedure Laterality Date   HERNIA REPAIR         No family history on file.  Social History   Tobacco Use   Smoking status: Current Every Day Smoker    Packs/day: 0.50    Types: Cigars   Smokeless tobacco: Never Used  Substance Use Topics   Alcohol use: Yes    Comment: approximately 5 days out of the month   Drug use: No    Home Medications Prior to Admission medications   Medication Sig Start Date End Date Taking? Authorizing Provider  dicyclomine (BENTYL) 20 MG tablet Take 1 tablet (20 mg total) by mouth 3 (three) times daily as needed for spasms. 06/24/19   Sharman Cheek, MD  doxycycline (VIBRAMYCIN) 100 MG capsule Take 1 capsule (100 mg total) by mouth 2 (two) times daily for 7 days. 08/05/20 08/12/20  Farrel Gordon, PA-C  naproxen (NAPROSYN) 500 MG tablet Take 1 tablet (500 mg total) by  mouth 2 (two) times daily with a meal. 01/21/16   Evon Slack, PA-C  ondansetron (ZOFRAN ODT) 4 MG disintegrating tablet Take 1 tablet (4 mg total) by mouth every 8 (eight) hours as needed for nausea or vomiting. 06/24/19   Sharman Cheek, MD    Allergies    Patient has no known allergies.  Review of Systems   Review of Systems  Constitutional: Negative for diaphoresis, fatigue and fever.  Eyes: Negative for visual disturbance.  Respiratory: Negative for shortness of breath.   Cardiovascular: Negative for chest pain.  Gastrointestinal: Negative for nausea and vomiting.  Genitourinary: Positive for discharge and dysuria. Negative for decreased urine volume, difficulty urinating, enuresis, flank pain, frequency, genital sores, hematuria, penile pain, penile swelling, scrotal swelling, testicular pain and urgency.  Musculoskeletal: Negative for back pain and myalgias.  Skin: Negative for color change, pallor, rash and wound.  Neurological: Negative for syncope, weakness, light-headedness, numbness and headaches.  Psychiatric/Behavioral: Negative for behavioral problems and confusion.    Physical Exam Updated Vital Signs BP 118/68    Pulse 74    Temp 98.2 F (36.8 C) (Oral)    Resp 16    SpO2 99%   Physical Exam Exam conducted with a chaperone present.  Constitutional:      General: He is not in acute distress.    Appearance: Normal appearance. He is not ill-appearing,  toxic-appearing or diaphoretic.  Cardiovascular:     Rate and Rhythm: Normal rate and regular rhythm.     Pulses: Normal pulses.  Pulmonary:     Effort: Pulmonary effort is normal.     Breath sounds: Normal breath sounds.  Abdominal:     General: Abdomen is flat. There is no distension.     Tenderness: There is no abdominal tenderness. There is no guarding or rebound.  Genitourinary:    Pubic Area: No rash or pubic lice.      Penis: Normal and circumcised. No discharge or lesions.      Testes: Normal.  Cremasteric reflex is present.        Right: Mass, tenderness or swelling not present.        Left: Mass, tenderness or swelling not present.     Epididymis:     Right: Normal.     Left: Normal.     Comments: Chaperone present.  Penis normal, no lesions noted.  No discharge.  No tenderness, testes and epididymis normal. Musculoskeletal:        General: Normal range of motion.  Skin:    General: Skin is warm and dry.     Capillary Refill: Capillary refill takes less than 2 seconds.  Neurological:     General: No focal deficit present.     Mental Status: He is alert and oriented to person, place, and time.  Psychiatric:        Mood and Affect: Mood normal.        Behavior: Behavior normal.        Thought Content: Thought content normal.     ED Results / Procedures / Treatments   Labs (all labs ordered are listed, but only abnormal results are displayed) Labs Reviewed  URINALYSIS, ROUTINE W REFLEX MICROSCOPIC  GC/CHLAMYDIA PROBE AMP ([place]) NOT AT [facility]    EKG None  Radiology No results found.  Procedures Procedures (including critical care time)  Medications Ordered in ED Medications  cefTRIAXone (ROCEPHIN) injection 500 mg (has no administration in time range)    ED Course  I have reviewed the triage vital signs and the nursing notes.  Pertinent labs & imaging results that were available during my care of the patient were reviewed by me and considered in my medical decision making (see chart for details).    MDM Rules/Calculators/A&P                          Marvin Myers is a 24 y.o. male with no pertinent medical history that presents the emergency department today for STD exposure.  Started having discharge 2 days ago,.  Normal vitals.  Normal GU and abdominal exam. To be tested for anything besides gonorrhea and chlamydia, however does want to be prophylactically treated for gonorrhea at this time.  Will prescribe doxycycline for chlamydia.  Patient  education discussed. Pt to be discharged, UA and STD test pending. Do not think pt needs to stay and wait for UA.  Doubt need for further emergent work up at this time. I explained the diagnosis and have given explicit precautions to return to the ER including for any other new or worsening symptoms. The patient understands and accepts the medical plan as it's been dictated and I have answered their questions. Discharge instructions concerning home care and prescriptions have been given. The patient is STABLE and is discharged to home in good condition.  Final Clinical Impression(s) /  ED Diagnoses Final diagnoses:  STD exposure    Rx / DC Orders ED Discharge Orders         Ordered    doxycycline (VIBRAMYCIN) 100 MG capsule  2 times daily        08/05/20 1218           Farrel Gordon, PA-C 08/05/20 1229    Pricilla Loveless, MD 08/09/20 [phone]

## 2020-08-05 NOTE — ED Triage Notes (Signed)
Pt arrives to ED for STD check. + s/s

## 2020-08-05 NOTE — Discharge Instructions (Addendum)
You are seen today for STD exposure.  Please take the antibiotics as directed for the next 7 days.  Do not have intercourse for 2 weeks.  Please get this rechecked.  If you still continue to have symptoms after you are done with your antibiotics come back here or to the health department to get rechecked.  Use the attached instructions.  Went to the emergency department for any new or worsening concerns symptoms.  You will see your results on MyChart in regards to your STDs in about 2 days.

## 2020-08-07 LAB — GC/CHLAMYDIA PROBE AMP (~~LOC~~) NOT AT ARMC
Chlamydia: POSITIVE — AB
Comment: NEGATIVE
Comment: NORMAL
Neisseria Gonorrhea: POSITIVE — AB

## 2021-07-31 ENCOUNTER — Emergency Department
Admission: EM | Admit: 2021-07-31 | Discharge: 2021-07-31 | Disposition: A | Discharge status: Home / Self Care | Payer: Self-pay | Attending: Emergency Medicine | Admitting: Emergency Medicine

## 2021-07-31 ENCOUNTER — Other Ambulatory Visit: Payer: Self-pay

## 2021-07-31 DIAGNOSIS — F17210 Nicotine dependence, cigarettes, uncomplicated: Secondary | ICD-10-CM | POA: Insufficient documentation

## 2021-07-31 DIAGNOSIS — Z202 Contact with and (suspected) exposure to infections with a predominantly sexual mode of transmission: Secondary | ICD-10-CM | POA: Insufficient documentation

## 2021-07-31 LAB — CHLAMYDIA/NGC RT PCR (ARMC ONLY)
Chlamydia Tr: DETECTED — AB
N gonorrhoeae: DETECTED — AB

## 2021-07-31 MED ORDER — AZITHROMYCIN 500 MG PO TABS
1000.0000 mg | ORAL_TABLET | Freq: Once | ORAL | Status: AC
  Administered 2021-07-31: 1000 mg via ORAL
  Filled 2021-07-31: qty 2

## 2021-07-31 MED ORDER — CEFTRIAXONE SODIUM 1 G IJ SOLR
500.0000 mg | Freq: Once | INTRAMUSCULAR | Status: AC
Start: 2021-07-31 — End: 2021-07-31
  Administered 2021-07-31: 500 mg via INTRAMUSCULAR
  Filled 2021-07-31: qty 10

## 2021-07-31 NOTE — Discharge Instructions (Signed)
Notify partners if your test is positive Refrain from sex for 7 days after both partners have been treated Use condoms to prevent stds Return if worsening Follow up with the health department for hiv and syphilis testing

## 2021-07-31 NOTE — ED Notes (Signed)
See triage note  present with penile discharge  states he was told that he was exposed to [facility] and chlamydia by girlfriend

## 2021-07-31 NOTE — ED Provider Notes (Signed)
[facility] Emergency Department Provider Note  ____________________________________________   Event Date/Time   First MD Initiated Contact with Patient 07/31/21 [phone]     (approximate)  I have reviewed the triage vital signs and the nursing notes.   HISTORY  Chief Complaint Penile Discharge    HPI Marvin Myers is a 25 y.o. male presents emergency department with penile discharge.  Patient states his girlfriend told him she had tested positive for gonorrhea and chlamydia.  States he has some drainage.  Denies fever or chills.  History reviewed. No pertinent past medical history.  There are no problems to display for this patient.   Past Surgical History:  Procedure Laterality Date   HERNIA REPAIR      Prior to Admission medications   Not on File    Allergies Patient has no known allergies.  No family history on file.  Social History Social History   Tobacco Use   Smoking status: Every Day    Packs/day: 0.50    Types: Cigars, Cigarettes   Smokeless tobacco: Never  Substance Use Topics   Alcohol use: Yes    Comment: approximately 5 days out of the month   Drug use: No    Review of Systems  Constitutional: No fever/chills Eyes: No visual changes. ENT: No sore throat. Respiratory: Denies cough Gastrointestinal: Denies abdominal pain Genitourinary: Negative for dysuria.  Positive for penile discharge Musculoskeletal: Negative for back pain. Skin: Negative for rash. Psychiatric: no mood changes,     ____________________________________________   PHYSICAL EXAM:  VITAL SIGNS: ED Triage Vitals  Enc Vitals Group     BP 07/31/21 0850 112/69     Pulse Rate 07/31/21 0850 74     Resp 07/31/21 0850 16     Temp 07/31/21 0850 98.2 F (36.8 C)     Temp Source 07/31/21 0850 Oral     SpO2 07/31/21 0850 97 %     Weight 07/31/21 0853 165 lb (74.8 kg)     Height 07/31/21 0853 5\' 6"  (1.676 m)     Head Circumference --       Peak Flow --      Pain Score 07/31/21 0853 6     Pain Loc --      Pain Edu? --      Excl. in GC? --     Constitutional: Alert and oriented. Well appearing and in no acute distress. Eyes: Conjunctivae are normal.  Head: Atraumatic. Nose: No congestion/rhinnorhea. Mouth/Throat: Mucous membranes are moist.   Neck:  supple no lymphadenopathy noted Cardiovascular: Normal rate, regular rhythm.  Respiratory: Normal respiratory effort.  No retractions, GU: deferred by the patient Musculoskeletal: FROM all extremities, warm and well perfused Neurologic:  Normal speech and language.  Skin:  Skin is warm, dry and intact. No rash noted. Psychiatric: Mood and affect are normal. Speech and behavior are normal.  ____________________________________________   LABS (all labs ordered are listed, but only abnormal results are displayed)  Labs Reviewed  CHLAMYDIA/NGC RT PCR ([facility] ONLY)             ____________________________________________   ____________________________________________  RADIOLOGY    ____________________________________________   PROCEDURES  Procedure(s) performed: No  Procedures    ____________________________________________   INITIAL IMPRESSION / ASSESSMENT AND PLAN / ED COURSE  Pertinent labs & imaging results that were available during my care of the patient were reviewed by me and considered in my medical decision making (see chart for details).   Patient is  a 25 year old male presents emergency department with penile discharge and positive exposure to STD.  See HPI.  Physical exam shows patient appears stable.  Patient is deferring the genital exam.  Due to the positive exposure we will empirically treat the patient.  I explained to him he can find his test results via [facility] MyChart.  Discharge stable condition     Marvin Myers was evaluated in Emergency Department on 07/31/2021 for the symptoms described in the history of present illness.  He was evaluated in the context of the global COVID-19 pandemic, which necessitated consideration that the patient might be at risk for infection with the SARS-CoV-2 virus that causes COVID-19. Institutional protocols and algorithms that pertain to the evaluation of patients at risk for COVID-19 are in a state of rapid change based on information released by regulatory bodies including the CDC and federal and state organizations. These policies and algorithms were followed during the patient's care in the ED.    As part of my medical decision making, I reviewed the following data within the electronic MEDICAL RECORD NUMBER Nursing notes reviewed and incorporated, Labs reviewed , Old chart reviewed, Notes from prior ED visits, and [place] Controlled Substance Database  ____________________________________________   FINAL CLINICAL IMPRESSION(S) / ED DIAGNOSES  Final diagnoses:  STD exposure      NEW MEDICATIONS STARTED DURING THIS VISIT:  New Prescriptions   No medications on file     Note:  This document was prepared using Dragon voice recognition software and may include unintentional dictation errors.    Faythe Ghee, PA-C 07/31/21 [id number]    Arnaldo Natal, MD 07/31/21 1215

## 2021-07-31 NOTE — ED Triage Notes (Signed)
Pt c/o having penile discharge

## 2021-08-01 ENCOUNTER — Telehealth: Payer: Self-pay | Admitting: Emergency Medicine
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
## 2022-05-12 ENCOUNTER — Emergency Department: Payer: Self-pay

## 2022-05-12 ENCOUNTER — Emergency Department (HOSPITAL_COMMUNITY)
Admission: EM | Admit: 2022-05-12 | Discharge: 2022-05-12 | Disposition: A | Discharge status: Home / Self Care | Payer: Self-pay | Attending: Emergency Medicine | Admitting: Emergency Medicine

## 2022-05-12 ENCOUNTER — Emergency Department
Admission: EM | Admit: 2022-05-12 | Discharge: 2022-05-12 | Discharge status: Against Medical Advice | Payer: Self-pay | Attending: Emergency Medicine | Admitting: Emergency Medicine

## 2022-05-12 ENCOUNTER — Encounter (HOSPITAL_COMMUNITY): Payer: Self-pay

## 2022-05-12 ENCOUNTER — Other Ambulatory Visit: Payer: Self-pay

## 2022-05-12 ENCOUNTER — Emergency Department (HOSPITAL_COMMUNITY): Payer: Self-pay

## 2022-05-12 DIAGNOSIS — S21139A Puncture wound without foreign body of unspecified front wall of thorax without penetration into thoracic cavity, initial encounter: Secondary | ICD-10-CM | POA: Insufficient documentation

## 2022-05-12 DIAGNOSIS — R000 Tachycardia, unspecified: Secondary | ICD-10-CM | POA: Insufficient documentation

## 2022-05-12 DIAGNOSIS — S0990XA Unspecified injury of head, initial encounter: Secondary | ICD-10-CM | POA: Insufficient documentation

## 2022-05-12 DIAGNOSIS — S20311A Abrasion of right front wall of thorax, initial encounter: Secondary | ICD-10-CM | POA: Insufficient documentation

## 2022-05-12 DIAGNOSIS — S41031A Puncture wound without foreign body of right shoulder, initial encounter: Secondary | ICD-10-CM | POA: Insufficient documentation

## 2022-05-12 DIAGNOSIS — Z23 Encounter for immunization: Secondary | ICD-10-CM | POA: Insufficient documentation

## 2022-05-12 DIAGNOSIS — S21131A Puncture wound without foreign body of right front wall of thorax without penetration into thoracic cavity, initial encounter: Secondary | ICD-10-CM

## 2022-05-12 DIAGNOSIS — Z5321 Procedure and treatment not carried out due to patient leaving prior to being seen by health care provider: Secondary | ICD-10-CM | POA: Insufficient documentation

## 2022-05-12 DIAGNOSIS — R200 Anesthesia of skin: Secondary | ICD-10-CM | POA: Insufficient documentation

## 2022-05-12 DIAGNOSIS — W3400XA Accidental discharge from unspecified firearms or gun, initial encounter: Secondary | ICD-10-CM | POA: Insufficient documentation

## 2022-05-12 DIAGNOSIS — S098XXA Other specified injuries of head, initial encounter: Secondary | ICD-10-CM

## 2022-05-12 LAB — COMPREHENSIVE METABOLIC PANEL
ALT: 19 U/L (ref 0–44)
AST: 37 U/L (ref 15–41)
Albumin: 4.6 g/dL (ref 3.5–5.0)
Alkaline Phosphatase: 51 U/L (ref 38–126)
Anion gap: 13 (ref 5–15)
BUN: 9 mg/dL (ref 6–20)
CO2: 17 mmol/L — ABNORMAL LOW (ref 22–32)
Calcium: 9 mg/dL (ref 8.9–10.3)
Chloride: 109 mmol/L (ref 98–111)
Creatinine, Ser: 1.1 mg/dL (ref 0.61–1.24)
GFR, Estimated: >60 mL/min (ref >60)
Glucose, Bld: 125 mg/dL — ABNORMAL HIGH (ref 70–99)
Potassium: 3.1 mmol/L — ABNORMAL LOW (ref 3.5–5.1)
Sodium: 139 mmol/L (ref 135–145)
Total Bilirubin: 0.7 mg/dL (ref 0.3–1.2)
Total Protein: 8.7 g/dL — ABNORMAL HIGH (ref 6.5–8.1)

## 2022-05-12 LAB — CBC WITH DIFFERENTIAL/PLATELET
Abs Immature Granulocytes: 0.03 10*3/uL (ref 0.00–0.07)
Basophils Absolute: 0 10*3/uL (ref 0.0–0.1)
Basophils Relative: 0 %
Eosinophils Absolute: 0 10*3/uL (ref 0.0–0.5)
Eosinophils Relative: 0 %
HCT: 47.2 % (ref 39.0–52.0)
Hemoglobin: 15.4 g/dL (ref 13.0–17.0)
Immature Granulocytes: 0 %
Lymphocytes Relative: 39 %
Lymphs Abs: 3.6 10*3/uL (ref 0.7–4.0)
MCH: 31 pg (ref 26.0–34.0)
MCHC: 32.6 g/dL (ref 30.0–36.0)
MCV: 95 fL (ref 80.0–100.0)
Monocytes Absolute: 1.2 10*3/uL — ABNORMAL HIGH (ref 0.1–1.0)
Monocytes Relative: 13 %
Neutro Abs: 4.3 10*3/uL (ref 1.7–7.7)
Neutrophils Relative %: 48 %
Platelets: 326 10*3/uL (ref 150–400)
RBC: 4.97 MIL/uL (ref 4.22–5.81)
RDW: 12.6 % (ref 11.5–15.5)
WBC: 9.1 10*3/uL (ref 4.0–10.5)
nRBC: 0 % (ref 0.0–0.2)

## 2022-05-12 LAB — TYPE AND SCREEN
ABO/RH(D): A POS
Antibody Screen: NEGATIVE

## 2022-05-12 LAB — PROTIME-INR
INR: 1 (ref 0.8–1.2)
Prothrombin Time: 13.2 seconds (ref 11.4–15.2)

## 2022-05-12 MED ORDER — MORPHINE SULFATE (PF) 4 MG/ML IV SOLN
4.0000 mg | Freq: Once | INTRAVENOUS | Status: AC
  Administered 2022-05-12: 4 mg via INTRAVENOUS

## 2022-05-12 MED ORDER — ONDANSETRON HCL 4 MG/2ML IJ SOLN
4.0000 mg | Freq: Once | INTRAMUSCULAR | Status: AC
  Administered 2022-05-12: 4 mg via INTRAVENOUS

## 2022-05-12 MED ORDER — MORPHINE SULFATE (PF) 4 MG/ML IV SOLN
INTRAVENOUS | Status: AC
  Filled 2022-05-12: qty 1

## 2022-05-12 MED ORDER — IOHEXOL 300 MG/ML  SOLN
100.0000 mL | Freq: Once | INTRAMUSCULAR | Status: AC | PRN
  Administered 2022-05-12: 100 mL via INTRAVENOUS

## 2022-05-12 MED ORDER — OXYCODONE-ACETAMINOPHEN 5-325 MG PO TABS
2.0000 | ORAL_TABLET | Freq: Once | ORAL | Status: AC
  Administered 2022-05-12: 2 via ORAL
  Filled 2022-05-12: qty 2

## 2022-05-12 MED ORDER — HYDROCODONE-ACETAMINOPHEN 5-325 MG PO TABS
1.0000 | ORAL_TABLET | ORAL | 0 refills | Status: AC | PRN
Start: 2022-05-12 — End: 2023-05-12

## 2022-05-12 MED ORDER — ONDANSETRON HCL 4 MG/2ML IJ SOLN
INTRAMUSCULAR | Status: AC
  Filled 2022-05-12: qty 2

## 2022-05-12 MED ORDER — CEPHALEXIN 500 MG PO CAPS: 500.0000 mg | ORAL_CAPSULE | Freq: Four times a day (QID) | ORAL | 0 refills | Status: AC

## 2022-05-12 MED ORDER — CEPHALEXIN 250 MG PO CAPS
500.0000 mg | ORAL_CAPSULE | Freq: Once | ORAL | Status: AC
  Administered 2022-05-12: 500 mg via ORAL
  Filled 2022-05-12: qty 2

## 2022-05-12 MED ORDER — MORPHINE SULFATE (PF) 4 MG/ML IV SOLN: 4.0000 mg | Freq: Once | INTRAVENOUS | Status: DC

## 2022-05-12 MED ORDER — TETANUS-DIPHTH-ACELL PERTUSSIS 5-2.5-18.5 LF-MCG/0.5 IM SUSY
0.5000 mL | PREFILLED_SYRINGE | Freq: Once | INTRAMUSCULAR | Status: AC
Start: 2022-05-12 — End: 2022-05-12
  Administered 2022-05-12: 0.5 mL via INTRAMUSCULAR
  Filled 2022-05-12: qty 0.5

## 2022-05-12 NOTE — ED Notes (Addendum)
Pt to CT with this RN, CT tech, LEO and MD. Pt making comments to LEO present and this Charity fundraiser. Pt able to move over to CT scanner without difficulty.

## 2022-05-12 NOTE — ED Notes (Signed)
Pt leaving room without CT scan result - pt walking out of room and officers following. This RN attempting to take IVs out but pt refusing and pushing this RNs hand away. Another RN with officers to take IV out.   Pt refusing to put paper scrubs on or gown or personal jeans. Pt ambulatory and exiting ER with officers and only in boxers.

## 2022-05-12 NOTE — Discharge Instructions (Addendum)
Follow up with your Physician for recheck  
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
## 2022-05-12 NOTE — ED Notes (Addendum)
This RN at bedside to inform pt that sister is here. This RN informed pt that sister cannot come back to room due to situation. Pt continues yelling at this RN. Pt stating to this RN that this RN works for him and that this RN is a Research officer, political party and this RN can suck his dick. Pt continues to state he wants to leave and this RN stating this would be AMA. Pt did not answer this RN and continues to yell and cuss and ambulate in room without difficulty.   This RN unable to reason with pt. MD at bedside to attempt to deescalate pt but unable to do so. Pt remains in room ambulatory and yelling at self.
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
## 2022-05-12 NOTE — ED Notes (Signed)
Pt out of bed and continues to yell from room, "help." Pt refusing morphine and yelling at this RN to get him a phone. RN provided him with phone and exited room due to safety concern.
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
## 2022-05-12 NOTE — ED Notes (Signed)
Pt refusing to sign AMA signature pad.

## 2022-05-12 NOTE — ED Notes (Signed)
Pt yelling at this RN and other staff in the room. Pt defensive and guarded and questioning staffs ability.

## 2022-05-12 NOTE — ED Notes (Addendum)
X-ray at bedside
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
## 2022-05-12 NOTE — ED Notes (Signed)
Pt back from CT with this RN and officers.
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
## 2022-05-12 NOTE — ED Notes (Signed)
Xray called for portable  

## 2022-06-14 ENCOUNTER — Ambulatory Visit: Payer: Self-pay | Attending: Physician Assistant | Admitting: Physician Assistant

## 2022-06-14 ENCOUNTER — Other Ambulatory Visit: Payer: Self-pay

## 2022-06-14 ENCOUNTER — Ambulatory Visit (HOSPITAL_COMMUNITY)
Admission: RE | Admit: 2022-06-14 | Discharge: 2022-06-14 | Disposition: A | Discharge status: Home / Self Care | Payer: Self-pay | Source: Ambulatory Visit | Attending: Physician Assistant | Admitting: Physician Assistant

## 2022-06-14 VITALS — BP 115/77 | HR 76 | Temp 98.4°F | Resp 14 | Ht 66.0 in | Wt 153.0 lb

## 2022-06-14 DIAGNOSIS — M25531 Pain in right wrist: Secondary | ICD-10-CM

## 2022-06-14 DIAGNOSIS — W3400XD Accidental discharge from unspecified firearms or gun, subsequent encounter: Secondary | ICD-10-CM

## 2022-06-14 DIAGNOSIS — M62838 Other muscle spasm: Secondary | ICD-10-CM

## 2022-06-14 DIAGNOSIS — R29898 Other symptoms and signs involving the musculoskeletal system: Secondary | ICD-10-CM

## 2022-06-14 DIAGNOSIS — Z09 Encounter for follow-up examination after completed treatment for conditions other than malignant neoplasm: Secondary | ICD-10-CM

## 2022-06-14 MED ORDER — METHOCARBAMOL 500 MG PO TABS
1000.0000 mg | ORAL_TABLET | Freq: Three times a day (TID) | ORAL | 0 refills | Status: AC | PRN
  Filled 2022-06-14: qty 90, 15d supply, fill #0

## 2022-06-14 MED ORDER — NAPROXEN 500 MG PO TABS
500.0000 mg | ORAL_TABLET | Freq: Two times a day (BID) | ORAL | 0 refills | Status: AC
  Filled 2022-06-14: qty 60, 30d supply, fill #0
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
## 2022-06-18 ENCOUNTER — Telehealth: Payer: Self-pay | Admitting: General Practice

## 2022-06-18 NOTE — Telephone Encounter (Signed)
Copied from CRM [id number]. Topic: General - Inquiry >> Jun 18, 2022 11:55 AM Marvin Myers wrote: Reason for CRM: Pt called saying he had xray Thursday and has not got the results back.  He would like for someone to call him  CB@  [phone]

## 2022-06-19 NOTE — Telephone Encounter (Signed)
Pt returned call for results, please advise

## 2022-06-19 NOTE — Telephone Encounter (Signed)
Called patient but was unable to make contact or leave a voicemail due to it being full.  [facility] has not yet reviewed results from all 3 scans

## 2022-06-20 NOTE — Telephone Encounter (Signed)
Pt returning call for lab results  

## 2022-06-21 NOTE — Telephone Encounter (Signed)
Patient was called by travia,RN and results were given

## 2022-06-22 ENCOUNTER — Encounter: Payer: Self-pay | Admitting: Neurology

## 2022-07-23 ENCOUNTER — Ambulatory Visit: Payer: Self-pay | Admitting: Critical Care Medicine
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
## 2022-11-22 ENCOUNTER — Encounter: Payer: Self-pay | Admitting: Neurology

## 2022-11-22 ENCOUNTER — Ambulatory Visit: Payer: Self-pay | Admitting: Neurology

## 2022-11-22 DIAGNOSIS — Z029 Encounter for administrative examinations, unspecified: Secondary | ICD-10-CM

## 2023-03-20 ENCOUNTER — Other Ambulatory Visit (HOSPITAL_BASED_OUTPATIENT_CLINIC_OR_DEPARTMENT_OTHER): Payer: Self-pay
